# Patient Record
Sex: Female | Born: 1995
Health system: Southern US, Community
[De-identification: ages and names within clinical notes are randomized; demographics above are authoritative.]

## PROBLEM LIST (undated history)

## (undated) DIAGNOSIS — R519 Headache, unspecified: Secondary | ICD-10-CM

## (undated) HISTORY — DX: Headache, unspecified: R51.9

---

## 2017-02-02 ENCOUNTER — Other Ambulatory Visit (HOSPITAL_COMMUNITY)
Admission: RE | Admit: 2017-02-02 | Discharge: 2017-02-02 | Disposition: A | Discharge status: Home / Self Care | Payer: 59 | Source: Ambulatory Visit | Attending: Obstetrics & Gynecology | Admitting: Obstetrics & Gynecology

## 2017-02-02 ENCOUNTER — Ambulatory Visit (INDEPENDENT_AMBULATORY_CARE_PROVIDER_SITE_OTHER): Payer: 59 | Admitting: Certified Nurse Midwife

## 2017-02-02 ENCOUNTER — Encounter: Payer: Self-pay | Admitting: Certified Nurse Midwife

## 2017-02-02 VITALS — BP 108/70 | HR 68 | Resp 16 | Ht 63.25 in | Wt 215.0 lb

## 2017-02-02 DIAGNOSIS — Z0000 Encounter for general adult medical examination without abnormal findings: Secondary | ICD-10-CM | POA: Diagnosis not present

## 2017-02-02 DIAGNOSIS — D649 Anemia, unspecified: Secondary | ICD-10-CM | POA: Insufficient documentation

## 2017-02-02 DIAGNOSIS — Z124 Encounter for screening for malignant neoplasm of cervix: Secondary | ICD-10-CM | POA: Diagnosis not present

## 2017-02-02 DIAGNOSIS — N920 Excessive and frequent menstruation with regular cycle: Secondary | ICD-10-CM | POA: Insufficient documentation

## 2017-02-02 DIAGNOSIS — Z01419 Encounter for gynecological examination (general) (routine) without abnormal findings: Secondary | ICD-10-CM

## 2017-02-02 DIAGNOSIS — Z30011 Encounter for initial prescription of contraceptive pills: Secondary | ICD-10-CM

## 2017-02-02 LAB — POCT URINALYSIS DIPSTICK
BILIRUBIN UA: NEGATIVE
GLUCOSE UA: NEGATIVE
Ketones, UA: NEGATIVE
Nitrite, UA: NEGATIVE
Protein, UA: NEGATIVE
Urobilinogen, UA: NEGATIVE E.U./dL — AB
pH, UA: 5 (ref 5.0–8.0)

## 2017-02-02 MED ORDER — NORGESTIM-ETH ESTRAD TRIPHASIC 0.18/0.215/0.25 MG-35 MCG PO TABS: ORAL_TABLET | ORAL | 4 refills | Status: DC

## 2017-02-02 NOTE — Patient Instructions (Signed)
General topics  Next pap or exam is  due in 1 year Take a Women's multivitamin Take 1200 mg. of calcium daily - prefer dietary If any concerns in interim to call back  Breast Self-Awareness Practicing breast self-awareness may pick up problems early, prevent significant medical complications, and possibly save your life. By practicing breast self-awareness, you can become familiar with how your breasts look and feel and if your breasts are changing. This allows you to notice changes early. It can also offer you some reassurance that your breast health is good. One way to learn what is normal for your breasts and whether your breasts are changing is to do a breast self-exam. If you find a lump or something that was not present in the past, it is best to contact your caregiver right away. Other findings that should be evaluated by your caregiver include nipple discharge, especially if it is bloody; skin changes or reddening; areas where the skin seems to be pulled in (retracted); or new lumps and bumps. Breast pain is seldom associated with cancer (malignancy), but should also be evaluated by a caregiver. BREAST SELF-EXAM The best time to examine your breasts is 5 7 days after your menstrual period is over.  ExitCare Patient Information 2013 [employer].   Exercise to Stay Healthy Exercise helps you become and stay healthy. EXERCISE IDEAS AND TIPS Choose exercises that:  You enjoy.  Fit into your day. You do not need to exercise really hard to be healthy. You can do exercises at a slow or medium level and stay healthy. You can:  Stretch before and after working out.  Try yoga, Pilates, or tai chi.  Lift weights.  Walk fast, swim, jog, run, climb stairs, bicycle, dance, or rollerskate.  Take aerobic classes. Exercises that burn about 150 calories:  Running 1  miles in 15 minutes.  Playing volleyball for 45 to 60 minutes.  Washing and waxing a car for 45 to 60  minutes.  Playing touch football for 45 minutes.  Walking 1  miles in 35 minutes.  Pushing a stroller 1  miles in 30 minutes.  Playing basketball for 30 minutes.  Raking leaves for 30 minutes.  Bicycling 5 miles in 30 minutes.  Walking 2 miles in 30 minutes.  Dancing for 30 minutes.  Shoveling snow for 15 minutes.  Swimming laps for 20 minutes.  Walking up stairs for 15 minutes.  Bicycling 4 miles in 15 minutes.  Gardening for 30 to 45 minutes.  Jumping rope for 15 minutes.  Washing windows or floors for 45 to 60 minutes. Document Released: 10/01/2010 Document Revised: 11/21/2011 Document Reviewed: 10/01/2010 ExitCare Patient Information 2013 [employer].   Other topics ( that may be useful information):    Sexually Transmitted Disease Sexually transmitted disease (STD) refers to any infection that is passed from person to person during sexual activity. This may happen by way of saliva, semen, blood, vaginal mucus, or urine. Common STDs include:  Gonorrhea.  Chlamydia.  Syphilis.  HIV/AIDS.  Genital herpes.  Hepatitis B and C.  Trichomonas.  Human papillomavirus (HPV).  Pubic lice. CAUSES  An STD may be spread by bacteria, virus, or parasite. A person can get an STD by:  Sexual intercourse with an infected person.  Sharing sex toys with an infected person.  Sharing needles with an infected person.  Having intimate contact with the genitals, mouth, or rectal areas of an infected person. SYMPTOMS  Some people may not have any symptoms, but   they can still pass the infection to others. Different STDs have different symptoms. Symptoms include:  Painful or bloody urination.  Pain in the pelvis, abdomen, vagina, anus, throat, or eyes.  Skin rash, itching, irritation, growths, or sores (lesions). These usually occur in the genital or anal area.  Abnormal vaginal discharge.  Penile discharge in men.  Soft, flesh-colored skin growths in the  genital or anal area.  Fever.  Pain or bleeding during sexual intercourse.  Swollen glands in the groin area.  Yellow skin and eyes (jaundice). This is seen with hepatitis. DIAGNOSIS  To make a diagnosis, your caregiver may:  Take a medical history.  Perform a physical exam.  Take a specimen (culture) to be examined.  Examine a sample of discharge under a microscope.  Perform blood test TREATMENT   Chlamydia, gonorrhea, trichomonas, and syphilis can be cured with antibiotic medicine.  Genital herpes, hepatitis, and HIV can be treated, but not cured, with prescribed medicines. The medicines will lessen the symptoms.  Genital warts from HPV can be treated with medicine or by freezing, burning (electrocautery), or surgery. Warts may come back.  HPV is a virus and cannot be cured with medicine or surgery.However, abnormal areas may be followed very closely by your caregiver and may be removed from the cervix, vagina, or vulva through office procedures or surgery. If your diagnosis is confirmed, your recent sexual partners need treatment. This is true even if they are symptom-free or have a negative culture or evaluation. They should not have sex until their caregiver says it is okay. HOME CARE INSTRUCTIONS  All sexual partners should be informed, tested, and treated for all STDs.  Take your antibiotics as directed. Finish them even if you start to feel better.  Only take over-the-counter or prescription medicines for pain, discomfort, or fever as directed by your caregiver.  Rest.  Eat a balanced diet and drink enough fluids to keep your urine clear or pale yellow.  Do not have sex until treatment is completed and you have followed up with your caregiver. STDs should be checked after treatment.  Keep all follow-up appointments, Pap tests, and blood tests as directed by your caregiver.  Only use latex condoms and water-soluble lubricants during sexual activity. Do not use  petroleum jelly or oils.  Avoid alcohol and illegal drugs.  Get vaccinated for HPV and hepatitis. If you have not received these vaccines in the past, talk to your caregiver about whether one or both might be right for you.  Avoid risky sex practices that can break the skin. The only way to avoid getting an STD is to avoid all sexual activity.Latex condoms and dental dams (for oral sex) will help lessen the risk of getting an STD, but will not completely eliminate the risk. SEEK MEDICAL CARE IF:   You have a fever.  You have any new or worsening symptoms. Document Released: 11/19/2002 Document Revised: 11/21/2011 Document Reviewed: 11/26/2010 [facility] Patient Information 2013 [employer].    Domestic Abuse You are being battered or abused if someone close to you hits, pushes, or physically hurts you in any way. You also are being abused if you are forced into activities. You are being sexually abused if you are forced to have sexual contact of any kind. You are being emotionally abused if you are made to feel worthless or if you are constantly threatened. It is important to remember that help is available. No one has the right to abuse you. PREVENTION OF FURTHER  ABUSE  Learn the warning signs of danger. This varies with situations but may include: the use of alcohol, threats, isolation from friends and family, or forced sexual contact. Leave if you feel that violence is going to occur.  If you are attacked or beaten, report it to the police so the abuse is documented. You do not have to press charges. The police can protect you while you or the attackers are leaving. Get the officer's name and badge number and a copy of the report.  Find someone you can trust and tell them what is happening to you: your caregiver, a nurse, clergy member, close friend or family member. Feeling ashamed is natural, but remember that you have done nothing wrong. No one deserves abuse. Document Released:  08/26/2000 Document Revised: 11/21/2011 Document Reviewed: 11/04/2010 ExitCare Patient Information 2013 [employer].    How Much is Too Much Alcohol? Drinking too much alcohol can cause injury, accidents, and health problems. These types of problems can include:   Car crashes.  Falls.  Family fighting (domestic violence).  Drowning.  Fights.  Injuries.  Burns.  Damage to certain organs.  Having a baby with birth defects. ONE DRINK CAN BE TOO MUCH WHEN YOU ARE:  Working.  Pregnant or breastfeeding.  Taking medicines. Ask your doctor.  Driving or planning to drive. If you or someone you know has a drinking problem, get help from a doctor.  Document Released: 06/25/2009 Document Revised: 11/21/2011 Document Reviewed: 06/25/2009 ExitCare Patient Information 2013 [employer].   Smoking Hazards Smoking cigarettes is extremely bad for your health. Tobacco smoke has over 200 known poisons in it. There are over 60 chemicals in tobacco smoke that cause cancer. Some of the chemicals found in cigarette smoke include:   Cyanide.  Benzene.  Formaldehyde.  Methanol (wood alcohol).  Acetylene (fuel used in welding torches).  Ammonia. Cigarette smoke also contains the poisonous gases nitrogen oxide and carbon monoxide.  Cigarette smokers have an increased risk of many serious medical problems and Smoking causes approximately:  90% of all lung cancer deaths in men.  80% of all lung cancer deaths in women.  90% of deaths from chronic obstructive lung disease. Compared with nonsmokers, smoking increases the risk of:  Coronary heart disease by 2 to 4 times.  Stroke by 2 to 4 times.  Men developing lung cancer by 23 times.  Women developing lung cancer by 13 times.  Dying from chronic obstructive lung diseases by 12 times.  . Smoking is the most preventable cause of death and disease in our society.  WHY IS SMOKING ADDICTIVE?  Nicotine is the chemical  agent in tobacco that is capable of causing addiction or dependence.  When you smoke and inhale, nicotine is absorbed rapidly into the bloodstream through your lungs. Nicotine absorbed through the lungs is capable of creating a powerful addiction. Both inhaled and non-inhaled nicotine may be addictive.  Addiction studies of cigarettes and spit tobacco show that addiction to nicotine occurs mainly during the teen years, when young people begin using tobacco products. WHAT ARE THE BENEFITS OF QUITTING?  There are many health benefits to quitting smoking.   Likelihood of developing cancer and heart disease decreases. Health improvements are seen almost immediately.  Blood pressure, pulse rate, and breathing patterns start returning to normal soon after quitting. QUITTING SMOKING   American Lung Association - 1-800-LUNGUSA  American Cancer Society - 1-800-ACS-2345 Document Released: 10/06/2004 Document Revised: 11/21/2011 Document Reviewed: 06/10/2009 ExitCare Patient Information 2013 ExitCare[employer]   LL[employer].   Stress Management Stress is a state of physical or mental tension that often results from changes in your life or normal routine. Some common causes of stress are:  Death of a loved one.  Injuries or severe illnesses.  Getting fired or changing jobs.  Moving into a new home. Other causes may be:  Sexual problems.  Business or financial losses.  Taking on a large debt.  Regular conflict with someone at home or at work.  Constant tiredness from lack of sleep. It is not just bad things that are stressful. It may be stressful to:  Win the lottery.  Get married.  Buy a new car. The amount of stress that can be easily tolerated varies from person to person. Changes generally cause stress, regardless of the types of change. Too much stress can affect your health. It may lead to physical or emotional problems. Too little stress (boredom) may also become stressful. SUGGESTIONS TO  REDUCE STRESS:  Talk things over with your family and friends. It often is helpful to share your concerns and worries. If you feel your problem is serious, you may want to get help from a professional counselor.  Consider your problems one at a time instead of lumping them all together. Trying to take care of everything at once may seem impossible. List all the things you need to do and then start with the most important one. Set a goal to accomplish 2 or 3 things each day. If you expect to do too many in a single day you will naturally fail, causing you to feel even more stressed.  Do not use alcohol or drugs to relieve stress. Although you may feel better for a short time, they do not remove the problems that caused the stress. They can also be habit forming.  Exercise regularly - at least 3 times per week. Physical exercise can help to relieve that "uptight" feeling and will relax you.  The shortest distance between despair and hope is often a good night's sleep.  Go to bed and get up on time allowing yourself time for appointments without being rushed.  Take a short "time-out" period from any stressful situation that occurs during the day. Close your eyes and take some deep breaths. Starting with the muscles in your face, tense them, hold it for a few seconds, then relax. Repeat this with the muscles in your neck, shoulders, hand, stomach, back and legs.  Take good care of yourself. Eat a balanced diet and get plenty of rest.  Schedule time for having fun. Take a break from your daily routine to relax. HOME CARE INSTRUCTIONS   Call if you feel overwhelmed by your problems and feel you can no longer manage them on your own.  Return immediately if you feel like hurting yourself or someone else. Document Released: 02/22/2001 Document Revised: 11/21/2011 Document Reviewed: 10/15/2007 ExitCare Patient Information 2013 [employer]Care[employer].   

## 2017-02-02 NOTE — Progress Notes (Signed)
21 y.o. G0P0000 Single  African American Fe here for annual exam. Periods normal, duration 3-4 days, minimal cramping no medication required. Light flow with OCP use. Contraception working well,denies warning signs. Currently sexually active with new partner with condom use also, had one occurrence of no protection. Stressing about and would like all STD screening. Partner has not been tested. Also feels like irritation on vulva from hair growth, no itching or burning, just sensitivity. Please check. No other health issues today. 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  Patient's last menstrual period was 01/15/2017.          Sexually active: Yes.    The current method of family planning is OCP (estrogen/progesterone).    Exercising: No.  exercise Smoker:  no  Health Maintenance: Pap:  5430yrs ago. Normal per patient History of Abnormal Pap: no MMG:  none Self Breast exams: occasional Colonoscopy:  none BMD:   none TDaP:  UTD Shingles: no Pneumonia: no Hep C and HIV: not done Labs: poct urine-rbc tr, wbc tr   reports that she has never smoked. She has never used smokeless tobacco. She reports that she drinks alcohol. She reports that she does not use drugs.  History reviewed. No pertinent past medical history.  History reviewed. No pertinent surgical history.  Current Outpatient Prescriptions  Medication Sig Dispense Refill  . Norgestimate-Ethinyl Estradiol Triphasic (ORTHO TRI-CYCLEN, 28,) 0.18/0.215/0.25 MG-35 MCG tablet Ortho Tri-Cyclen (28) 0.18 mg(7)/0.215 mg(7)/0.25 mg(7)-35 mcg tablet  TAKE 1 TABLET BY MOUTH EVERY DAY    . UNABLE TO FIND Refresh po     No current facility-administered medications for this visit.     Family History  Problem Relation Age of Onset  . Heart attack Maternal Grandmother   . Cancer Maternal Grandfather   . Diabetes Maternal Grandfather   . Hypertension Maternal Grandfather     ROS:  Pertinent items are noted in HPI.  Otherwise, a  comprehensive ROS was negative.  Exam:   BP 108/70   Pulse 68   Resp 16   Ht 5' 3.25" (1.607 m)   Wt 215 lb (97.5 kg)   LMP 01/15/2017   BMI 37.79 kg/m  Height: 5' 3.25" (160.7 cm) Ht Readings from Last 3 Encounters:  02/02/17 5' 3.25" (1.607 m)    General appearance: alert, cooperative and appears stated age Head: Normocephalic, without obvious abnormality, atraumatic Neck: no adenopathy, supple, symmetrical, trachea midline and thyroid normal to inspection and palpation Lungs: clear to auscultation bilaterally Breasts: normal appearance, no masses or tenderness, No nipple retraction or dimpling, No nipple discharge or bleeding, No axillary or supraclavicular adenopathy Heart: regular rate and rhythm Abdomen: soft, non-tender; no masses,  no organomegaly Extremities: extremities normal, atraumatic, no cyanosis or edema Skin: Skin color, texture, turgor normal. No rashes or lesions Lymph nodes: Cervical, supraclavicular, and axillary nodes normal. No abnormal inguinal nodes palpated Neurologic: Grossly normal   Pelvic: External genitalia:  no lesions, no redness or increase pink, no ingrown hair appearance, no scaling, patient identified area of concern, no abnormal finding seen              Urethra:  normal appearing urethra with no masses, tenderness or lesions              Bartholin's and Skene's: normal                 Vagina: normal appearing vagina with normal color and discharge, no lesions, specimens collected  Cervix: no bleeding following Pap, no cervical motion tenderness, no lesions and nulliparous appearance              Pap taken: Yes.   Bimanual Exam:  Uterus:  normal size, contour, position, consistency, mobility, non-tender              Adnexa: normal adnexa and no mass, fullness, tenderness               Rectovaginal: Confirms               Anus:  Normal appearance  Chaperone present: yes  A:  Well Woman with normal exam  Contraception OCP  desired  ? Irritation feel with perspiration on vulva  Screening labs  P:   Reviewed health and wellness pertinent to exam  Discussed continuing OCP, warning signs and risks/benefits. Patient doing well with good cycle control and consistent use.   Rx Ortho Tri-cyclen see order with instructions  Discussed what she is feeling on vulva could be from perspiration. No visible signs noted of skin change. Suggested coconut oil protection on vulva when will be active outside to see if this will stop the sensation she feels. Will advise if no change. Avoid shaving, clipping should work better. Questions addressed. Patient expressed she felt much better after exam and discussion.  Labs: STD panel, Hep C, HSV 1,2, Affirm  Pap smear: yes   counseled on breast self exam, STD prevention, HIV risk factors and prevention, feminine hygiene, use and side effects of OCP's, adequate intake of calcium and vitamin D, diet and exercise  return annually or prn  An After Visit Summary was printed and given to the patient.

## 2017-02-03 ENCOUNTER — Other Ambulatory Visit: Payer: Self-pay

## 2017-02-03 LAB — STD PANEL
HEP B S AG: NEGATIVE
HIV 1&2 Ab, 4th Generation: NONREACTIVE

## 2017-02-03 LAB — HEPATITIS C ANTIBODY: HCV Ab: NEGATIVE

## 2017-02-03 LAB — HSV(HERPES SIMPLEX VRS) I + II AB-IGG: HSV 2 Glycoprotein G Ab, IgG: <0.9 Index (ref <0.90)

## 2017-02-03 LAB — WET PREP BY MOLECULAR PROBE
Candida species: DETECTED — AB
GARDNERELLA VAGINALIS: NOT DETECTED
TRICHOMONAS VAG: NOT DETECTED

## 2017-02-03 MED ORDER — TERCONAZOLE 0.4 % VA CREA: TOPICAL_CREAM | VAGINAL | 0 refills | Status: DC

## 2017-02-07 ENCOUNTER — Telehealth: Payer: Self-pay | Admitting: Certified Nurse Midwife

## 2017-02-07 LAB — CYTOLOGY - PAP
Chlamydia: NEGATIVE
DIAGNOSIS: NEGATIVE
Neisseria Gonorrhea: NEGATIVE

## 2017-02-07 NOTE — Telephone Encounter (Signed)
Allison Duncan, CNM -please review HSV labs dated 02/02/17 and advise?

## 2017-02-07 NOTE — Telephone Encounter (Signed)
Patient is calling for recent lab results. Patient said she received some of her labs results last Friday but the HSV was not back yet.

## 2017-02-07 NOTE — Telephone Encounter (Signed)
Viewed by Cristopher EstimableKeviele Duncan on 02/07/2017 3:54 PM  Written by Allison Duncan, Allison Duncan, CMA on 02/07/2017 1:37 PM  Hello [name],  Your labwork for HSV (herpes) 1,2 are both negative, which is great news. Gonorrhea & Chlamydia are negative.  Pap smear is negative also. It only showed yeast on your pap but this was already addressed. You were already called with your other results.  Have a great day.     Routing to provider for final review. Patient is agreeable to disposition. Will close encounter.

## 2017-02-07 NOTE — Telephone Encounter (Signed)
Sent result note to Joy to call patient

## 2018-02-11 ENCOUNTER — Other Ambulatory Visit: Payer: Self-pay | Admitting: Certified Nurse Midwife

## 2018-02-11 DIAGNOSIS — Z30011 Encounter for initial prescription of contraceptive pills: Secondary | ICD-10-CM

## 2018-02-12 NOTE — Telephone Encounter (Signed)
Medication refill request: OCP  Last AEX:  02-02-17  Next AEX: Spoke with patient and she is seeing another provider- Patient advised that she does not need it Last MMG (if hormonal medication request): N/A Refill authorized: please advise

## 2019-07-14 ENCOUNTER — Encounter (HOSPITAL_COMMUNITY): Payer: Self-pay | Admitting: Emergency Medicine

## 2019-07-14 ENCOUNTER — Emergency Department (HOSPITAL_COMMUNITY)
Admission: EM | Admit: 2019-07-14 | Discharge: 2019-07-14 | Disposition: A | Discharge status: Home / Self Care | Payer: Managed Care, Other (non HMO) | Attending: Emergency Medicine | Admitting: Emergency Medicine

## 2019-07-14 ENCOUNTER — Other Ambulatory Visit: Payer: Self-pay

## 2019-07-14 ENCOUNTER — Emergency Department (HOSPITAL_COMMUNITY): Payer: Managed Care, Other (non HMO)

## 2019-07-14 DIAGNOSIS — Z79899 Other long term (current) drug therapy: Secondary | ICD-10-CM | POA: Insufficient documentation

## 2019-07-14 DIAGNOSIS — M79602 Pain in left arm: Secondary | ICD-10-CM | POA: Diagnosis present

## 2019-07-14 DIAGNOSIS — M5412 Radiculopathy, cervical region: Secondary | ICD-10-CM | POA: Insufficient documentation

## 2019-07-14 MED ORDER — TIZANIDINE HCL 4 MG PO TABS: 4.0000 mg | ORAL_TABLET | Freq: Four times a day (QID) | ORAL | 0 refills | Status: DC | PRN

## 2019-07-14 NOTE — ED Provider Notes (Signed)
[facility] EMERGENCY DEPARTMENT Provider Note   CSN: [id number] Arrival date & time: 07/14/19  1144     History   Chief Complaint Chief Complaint  Patient presents with  . Arm Pain    HPI Allison Duncan is a 23 y.o. female.     23 year old female presents with complaint of discomfort in her left arm for the past week. Patient went to UC and was told she had a sprain, treated with diclofenac without improvement. Patient returned to UC today, concerned something more was going on, now with discomfort in her right arm.  Patient was again told she had a sprain, given an injection of Toradol without improvement and then came to the emergency room for further work-up.  Patient reports prior strain to her right arm after lifting something at work several months ago, states that she did not have any injury with her pain at this time and questions a strain diagnosis.  Patient states that at times her left hand feels cold, no changes in the skin color, reports pins-and-needles feeling in her left hand at times.  No other complaints or concerns.     History reviewed. No pertinent past medical history.  Patient Active Problem List   Diagnosis Date Noted  . Anemia 02/02/2017  . Menorrhagia 02/02/2017    History reviewed. No pertinent surgical history.   OB History    Gravida  0   Para  0   Term  0   Preterm  0   AB  0   Living  0     SAB  0   TAB  0   Ectopic  0   Multiple  0   Live Births  0            Home Medications    Prior to Admission medications   Medication Sig Start Date End Date Taking? Authorizing Provider  Norgestimate-Ethinyl Estradiol Triphasic (ORTHO TRI-CYCLEN, 28,) 0.18/0.215/0.25 MG-35 MCG tablet Take one tablet daily by mouth 02/02/17   Verner Chol, CNM  terconazole (TERAZOL 7) 0.4 % vaginal cream Use 1 applicatorful vaginally every night for 7 nights 02/03/17   Verner Chol, CNM  tiZANidine (ZANAFLEX) 4 MG  tablet Take 1 tablet (4 mg total) by mouth every 6 (six) hours as needed for muscle spasms. 07/14/19   Jeannie Fend, PA-C  UNABLE TO FIND Refresh po    [provider]    Family History Family History  Problem Relation Age of Onset  . Heart attack Maternal Grandmother   . Cancer Maternal Grandfather   . Diabetes Maternal Grandfather   . Hypertension Maternal Grandfather     Social History Social History   Tobacco Use  . Smoking status: Never Smoker  . Smokeless tobacco: Never Used  Substance Use Topics  . Alcohol use: Yes    Comment: 2 a month  . Drug use: No     Allergies   Amoxicillin-pot clavulanate, Cefdinir, and Cefprozil   Review of Systems Review of Systems  Constitutional: Negative for fever.  Musculoskeletal: Positive for myalgias. Negative for arthralgias.  Skin: Negative for color change, rash and wound.  Allergic/Immunologic: Negative for immunocompromised state.  Neurological: Negative for weakness and numbness.  Psychiatric/Behavioral: Negative for confusion.  All other systems reviewed and are negative.    Physical Exam Updated Vital Signs BP 112/87 (BP Location: Right Arm)   Pulse (!) 102   Temp 98.5 F (36.9 C) (Oral)   Resp 16  LMP 07/06/2019   SpO2 100%   Physical Exam Vitals signs and nursing note reviewed.  Constitutional:      General: She is not in acute distress.    Appearance: She is well-developed. She is not diaphoretic.  HENT:     Head: Normocephalic and atraumatic.  Neck:     Musculoskeletal: Normal range of motion. Muscular tenderness present.  Cardiovascular:     Pulses: Normal pulses.  Pulmonary:     Effort: Pulmonary effort is normal.  Chest:    Musculoskeletal: Normal range of motion.        General: Tenderness present. No swelling, deformity or signs of injury.     Cervical back: She exhibits tenderness. She exhibits normal range of motion and no bony tenderness.       Back:  Skin:    General:  Skin is warm and dry.     Capillary Refill: Capillary refill takes less than 2 seconds.     Findings: No erythema or rash.  Neurological:     Mental Status: She is alert and oriented to person, place, and time.     Sensory: No sensory deficit.     Motor: No weakness.  Psychiatric:        Behavior: Behavior normal.      ED Treatments / Results  Labs (all labs ordered are listed, but only abnormal results are displayed) Labs Reviewed - No data to display  EKG None  Radiology Dg Cervical Spine Complete  Result Date: 07/14/2019 CLINICAL DATA:  C/o L upper arm feeling "funny" x 1 week. States she believes she has poor circulation in arm. Reports fingers have started to have spasms and tightness to L upper arm like a rubber band is on it. Seen earlier in week and took anti-inflammatory without relief. Now L side of neck feels sensitive and R arm is starting to feel the same way since yesterday. [facility] EXAM: CERVICAL SPINE - COMPLETE 4+ VIEW COMPARISON:  None. FINDINGS: Normal alignment allowing mild neck flexion. No fracture or bone lesion. Discs are well maintained. No degenerative changes. Neural foramina are widely patent. Soft tissues are unremarkable. IMPRESSION: Negative cervical spine radiographs. Electronically Signed   By: Amie Portlandavid  Ormond M.D.   On: 07/14/2019 14:44    Procedures Procedures (including critical care time)  Medications Ordered in ED Medications - No data to display   Initial Impression / Assessment and Plan / ED Course  I have reviewed the triage vital signs and the nursing notes.  Pertinent labs & imaging results that were available during my care of the patient were reviewed by me and considered in my medical decision making (see chart for details).  Clinical Course as of Jul 13 1536  Wynelle LinkSun Jul 14, 2019  [id number] 23 year old female with pain in her left arm denies pain to the right arm.  History and exam consistent with cervical  radiculopathy, no history of trauma.  Patient has tenderness through her left and right trapezius areas extending down along the medial border the scapula.  Also soreness in left right pectoral muscle area.  Grip strength is equal, sensation intact, strong radial pulses bilaterally.  Discussed likely cervical radiculopathy with patient, C-spine x-ray obtained without significant findings.  Patient been taking NSAIDs from urgent care, will add muscle relaxer to this, recommend warm compresses followed by gentle stretching and follow-up with PCP for possible physical therapy referral.  Also to discuss with work ergonomic changes to her  desk space.  Patient states was previously working on the floor at [employer] doing a lot of heavy lifting, now with promotion of sitting at a desk and more sedentary than usual.  Patient to return to ER for worsening or concerning symptoms otherwise follow-up as planned.   [LM]    Clinical Course User Index [LM] Tacy Learn, PA-C      Final Clinical Impressions(s) / ED Diagnoses   Final diagnoses:  Cervical radiculopathy    ED Discharge Orders         Ordered    tiZANidine (ZANAFLEX) 4 MG tablet  Every 6 hours PRN     07/14/19 1521           Tacy Learn, PA-C 07/14/19 1537    Carmin Muskrat, MD 07/14/19 1549

## 2019-07-14 NOTE — ED Notes (Signed)
Patient verbalizes understanding of discharge instructions. Opportunity for questioning and answers were provided. Armband removed by staff, pt discharged from ED.  

## 2019-07-14 NOTE — ED Triage Notes (Signed)
C/o L upper arm feeling "funny" x 1 week.  States she believes she has poor circulation in arm.  Reports fingers have started to have spasms and tightness to L upper arm like a rubber band is on it.  Seen earlier in week and took anti-inflammatory without relief.  Now L side of neck feels sensitive and R arm is starting to feel the same way since yesterday.  Seen at [facility] today for same.

## 2019-07-14 NOTE — Discharge Instructions (Addendum)
Warm compresses to shoulders for 30 minutes at a time followed by gentle stretching as discussed. You can continue taking the anti-inflammatory prescribed by urgent care, you can also take the muscle relaxer prescribed today.  Do not drive or operate machinery until you know how this muscle relaxer affects you. Follow-up with primary care provider to discuss possible referral to physical therapy. Talk to employee health at your job to see if someone can help work on Personal assistant at your workstation.

## 2019-11-25 ENCOUNTER — Encounter: Payer: Self-pay | Admitting: Certified Nurse Midwife

## 2019-11-27 ENCOUNTER — Encounter: Payer: Self-pay | Admitting: Certified Nurse Midwife

## 2019-12-28 ENCOUNTER — Emergency Department (HOSPITAL_COMMUNITY): Payer: Managed Care, Other (non HMO)

## 2019-12-28 ENCOUNTER — Encounter (HOSPITAL_COMMUNITY): Payer: Self-pay | Admitting: Emergency Medicine

## 2019-12-28 ENCOUNTER — Emergency Department (HOSPITAL_COMMUNITY)
Admission: EM | Admit: 2019-12-28 | Discharge: 2019-12-29 | Disposition: A | Discharge status: Home / Self Care | Payer: Managed Care, Other (non HMO) | Attending: Emergency Medicine | Admitting: Emergency Medicine

## 2019-12-28 ENCOUNTER — Other Ambulatory Visit: Payer: Self-pay

## 2019-12-28 DIAGNOSIS — J029 Acute pharyngitis, unspecified: Secondary | ICD-10-CM | POA: Diagnosis not present

## 2019-12-28 DIAGNOSIS — Z20822 Contact with and (suspected) exposure to covid-19: Secondary | ICD-10-CM | POA: Diagnosis not present

## 2019-12-28 DIAGNOSIS — J3081 Allergic rhinitis due to animal (cat) (dog) hair and dander: Secondary | ICD-10-CM | POA: Insufficient documentation

## 2019-12-28 DIAGNOSIS — R079 Chest pain, unspecified: Secondary | ICD-10-CM | POA: Diagnosis present

## 2019-12-28 LAB — BASIC METABOLIC PANEL
Anion gap: 8 (ref 5–15)
BUN: 10 mg/dL (ref 6–20)
CO2: 28 mmol/L (ref 22–32)
Calcium: 9.3 mg/dL (ref 8.9–10.3)
Chloride: 104 mmol/L (ref 98–111)
Creatinine, Ser: 0.8 mg/dL (ref 0.44–1.00)
GFR calc Af Amer: >60 mL/min (ref >60)
GFR calc non Af Amer: >60 mL/min (ref >60)
Glucose, Bld: 110 mg/dL — ABNORMAL HIGH (ref 70–99)
Potassium: 3.8 mmol/L (ref 3.5–5.1)
Sodium: 140 mmol/L (ref 135–145)

## 2019-12-28 LAB — CBC
HCT: 38.7 % (ref 36.0–46.0)
Hemoglobin: 12.6 g/dL (ref 12.0–15.0)
MCH: 30.2 pg (ref 26.0–34.0)
MCHC: 32.6 g/dL (ref 30.0–36.0)
MCV: 92.8 fL (ref 80.0–100.0)
Platelets: 253 10*3/uL (ref 150–400)
RBC: 4.17 MIL/uL (ref 3.87–5.11)
RDW: 12.2 % (ref 11.5–15.5)
WBC: 7.7 10*3/uL (ref 4.0–10.5)
nRBC: 0 % (ref 0.0–0.2)

## 2019-12-28 LAB — TROPONIN I (HIGH SENSITIVITY)
Troponin I (High Sensitivity): <2 ng/L (ref <18)
Troponin I (High Sensitivity): <2 ng/L (ref <18)

## 2019-12-28 LAB — I-STAT BETA HCG BLOOD, ED (MC, WL, AP ONLY): I-stat hCG, quantitative: <5 m[IU]/mL (ref <5)

## 2019-12-28 LAB — POC SARS CORONAVIRUS 2 AG -  ED: SARS Coronavirus 2 Ag: NEGATIVE

## 2019-12-28 MED ORDER — SODIUM CHLORIDE 0.9% FLUSH: 3.0000 mL | Freq: Once | INTRAVENOUS | Status: DC

## 2019-12-28 NOTE — ED Notes (Signed)
POC COVID result of NEGATIVE reported to Dr. C. Tegeler 

## 2019-12-28 NOTE — ED Triage Notes (Signed)
Pt reports sore throat and headache X1 week.  For the past two days has had substernal chest pain that radiates to her back.

## 2019-12-29 NOTE — ED Provider Notes (Addendum)
[facility] EMERGENCY DEPARTMENT Provider Note  CSN: [id number] Arrival date & time: 12/28/19 1921  Chief Complaint(s) Chest Pain and Sore Throat  HPI Allison Duncan is a 24 y.o. female    Sore Throat This is a recurrent problem. Episode onset: several weeks. The problem occurs constantly. The problem has not changed since onset.Associated symptoms include chest pain. The symptoms are aggravated by smoking and sneezing. Nothing relieves the symptoms. Treatments tried: clarithromycin rxd at [facility]. The treatment provided no relief.   Additionally patient is complaining of bilateral chest pain as well as upper back pain worse with sneezing, movement and palpation.  Patient reports she has seasonal allergies and is allergic to cats.  Reports that she recently got a cat.  She has not attempted to take any antihistamines.  Was seen in urgent care and diagnosed with sinusitis and given clarithromycin which has not improved her symptoms.  Past Medical History History reviewed. No pertinent past medical history. Patient Active Problem List   Diagnosis Date Noted  . Anemia 02/02/2017  . Menorrhagia 02/02/2017   Home Medication(s) Prior to Admission medications   Medication Sig Start Date End Date Taking? Authorizing Provider  Norgestimate-Ethinyl Estradiol Triphasic (ORTHO TRI-CYCLEN, 28,) 0.18/0.215/0.25 MG-35 MCG tablet Take one tablet daily by mouth 02/02/17   Verner Chol, CNM  terconazole (TERAZOL 7) 0.4 % vaginal cream Use 1 applicatorful vaginally every night for 7 nights 02/03/17   Verner Chol, CNM  tiZANidine (ZANAFLEX) 4 MG tablet Take 1 tablet (4 mg total) by mouth every 6 (six) hours as needed for muscle spasms. 07/14/19   Jeannie Fend, PA-C  UNABLE TO FIND Refresh po    [provider]                                                                                                                                    Past Surgical History  History reviewed. No pertinent surgical history. Family History Family History  Problem Relation Age of Onset  . Heart attack Maternal Grandmother   . Cancer Maternal Grandfather   . Diabetes Maternal Grandfather   . Hypertension Maternal Grandfather     Social History Social History   Tobacco Use  . Smoking status: Never Smoker  . Smokeless tobacco: Never Used  Substance Use Topics  . Alcohol use: Yes    Comment: 2 a month  . Drug use: No   Allergies Amoxicillin-pot clavulanate, Cefdinir, and Cefprozil  Review of Systems Review of Systems  Cardiovascular: Positive for chest pain.   All other systems are reviewed and are negative for acute change except as noted in the HPI  Physical Exam Vital Signs  I have reviewed the triage vital signs BP 120/71 (BP Location: Right Arm)   Pulse 72   Temp 98.2 F (36.8 C) (Oral)   Resp 17   Ht 5\' 3"  (1.6 m)   Wt 99.8 kg  LMP 12/08/2019   SpO2 99%   BMI 38.97 kg/m   Physical Exam Vitals reviewed.  Constitutional:      General: She is not in acute distress.    Appearance: She is well-developed. She is not diaphoretic.  HENT:     Head: Normocephalic and atraumatic.     Nose: Mucosal edema present.     Mouth/Throat:     Pharynx: No pharyngeal swelling, oropharyngeal exudate or uvula swelling.     Tonsils: No tonsillar exudate.     Comments: cobblestoning Eyes:     General: No scleral icterus.       Right eye: No discharge.        Left eye: No discharge.     Conjunctiva/sclera: Conjunctivae normal.     Pupils: Pupils are equal, round, and reactive to light.  Cardiovascular:     Rate and Rhythm: Normal rate and regular rhythm.     Heart sounds: No murmur. No friction rub. No gallop.   Pulmonary:     Effort: Pulmonary effort is normal. No respiratory distress.     Breath sounds: Normal breath sounds. No stridor. No rales.  Chest:     Chest wall: Tenderness present.    Abdominal:     General: There is no  distension.     Palpations: Abdomen is soft.     Tenderness: There is no abdominal tenderness.  Musculoskeletal:     Cervical back: Normal range of motion and neck supple.     Thoracic back: Tenderness present.       Back:  Skin:    General: Skin is warm and dry.     Findings: No erythema or rash.  Neurological:     Mental Status: She is alert and oriented to person, place, and time.     ED Results and Treatments Labs (all labs ordered are listed, but only abnormal results are displayed) Labs Reviewed  BASIC METABOLIC PANEL - Abnormal; Notable for the following components:      Result Value   Glucose, Bld 110 (*)    All other components within normal limits  CBC  I-STAT BETA HCG BLOOD, ED ([facility], [facility], [facility] ONLY)  POC SARS CORONAVIRUS 2 AG -  ED  TROPONIN I (HIGH SENSITIVITY)  TROPONIN I (HIGH SENSITIVITY)                                                                                                                         EKG  EKG Interpretation  Date/Time:  Saturday December 28 2019 20:06:26 EDT Ventricular Rate:  54 PR Interval:  162 QRS Duration: 96 QT Interval:  414 QTC Calculation: 392 R Axis:   88 Text Interpretation: Sinus bradycardia with sinus arrhythmia Incomplete right bundle branch block Borderline ECG No old tracing to compare Confirmed by Drema Pry [phone]) on 12/29/2019 4:48:21 AM      Radiology DG Chest Portable 1 View  Result Date: 12/28/2019 CLINICAL DATA:  Central chest pain for 3  days, pain radiating to left arm, sore throat EXAM: PORTABLE CHEST 1 VIEW COMPARISON:  None. FINDINGS: The heart size and mediastinal contours are within normal limits. Both lungs are clear. The visualized skeletal structures are unremarkable. IMPRESSION: No active disease. Electronically Signed   By: Randa Ngo M.D.   On: 12/28/2019 21:30    Pertinent labs & imaging results that were available during my care of the patient were reviewed by me and considered in my medical  decision making (see chart for details).  Medications Ordered in ED Medications  sodium chloride flush (NS) 0.9 % injection 3 mL (has no administration in time range)                                                                                                                                    Procedures Procedures  (including critical care time)  Medical Decision Making / ED Course I have reviewed the nursing notes for this encounter and the patient's prior records (if available in EHR or on provided paperwork).   Latonya Nelon was evaluated in Emergency Department on 12/29/2019 for the symptoms described in the history of present illness. She was evaluated in the context of the global COVID-19 pandemic, which necessitated consideration that the patient might be at risk for infection with the SARS-CoV-2 virus that causes COVID-19. Institutional protocols and algorithms that pertain to the evaluation of patients at risk for COVID-19 are in a state of rapid change based on information released by regulatory bodies including the CDC and federal and state organizations. These policies and algorithms were followed during the patient's care in the ED.  Exam is most suspicious for allergic rhinitis.  No evidence to suggest pharyngitis, epiglottitis.  Chest and back pain muscular related.  No cardiac symptoms.  Work-up in triage reassuring including nonischemic EKG and negative troponin.  No anemia, leukocytosis.  No significant electrolyte derangement or renal sufficiency.  Chest x-ray without evidence of pneumonia, pneumothorax.  Doubt pulmonary embolism.  Recommended over-the-counter antihistamines and steroid nasal sprays.  PCP follow-up      Final Clinical Impression(s) / ED Diagnoses Final diagnoses:  Allergic rhinitis due to animal hair and dander    The patient appears reasonably screened and/or stabilized for discharge and I doubt any other medical condition or other [facility] requiring  further screening, evaluation, or treatment in the ED at this time prior to discharge. Safe for discharge with strict return precautions.  Disposition: Discharge  Condition: Good  I have discussed the results, Dx and Tx plan with the patient/family who expressed understanding and agree(s) with the plan. Discharge instructions discussed at length. The patient/family was given strict return precautions who verbalized understanding of the instructions. No further questions at time of discharge.    ED Discharge Orders    None        Follow Up: Primary care provider  Schedule an appointment as soon as possible for  a visit  If you do not have a primary care physician, contact [facility] at [phone] for referral     This chart was dictated using voice recognition software.  Despite best efforts to proofread,  errors can occur which can change the documentation meaning.     Nira Conn, MD 12/29/19 [phone]

## 2019-12-29 NOTE — ED Notes (Signed)
Patient verbalizes understanding of discharge instructions. Opportunity for questioning and answers were provided. Armband removed by staff, pt discharged from ED stable & ambulatory  

## 2020-02-04 ENCOUNTER — Other Ambulatory Visit: Payer: Self-pay | Admitting: Obstetrics and Gynecology

## 2020-02-04 DIAGNOSIS — N644 Mastodynia: Secondary | ICD-10-CM

## 2020-03-25 ENCOUNTER — Other Ambulatory Visit: Payer: Managed Care, Other (non HMO)

## 2020-04-22 ENCOUNTER — Other Ambulatory Visit: Payer: Managed Care, Other (non HMO)

## 2020-06-09 DIAGNOSIS — Z3009 Encounter for other general counseling and advice on contraception: Secondary | ICD-10-CM | POA: Diagnosis not present

## 2020-06-09 DIAGNOSIS — R030 Elevated blood-pressure reading, without diagnosis of hypertension: Secondary | ICD-10-CM | POA: Diagnosis not present

## 2020-06-17 DIAGNOSIS — Z6839 Body mass index (BMI) 39.0-39.9, adult: Secondary | ICD-10-CM | POA: Diagnosis not present

## 2020-06-17 DIAGNOSIS — R87610 Atypical squamous cells of undetermined significance on cytologic smear of cervix (ASC-US): Secondary | ICD-10-CM | POA: Diagnosis not present

## 2020-06-17 DIAGNOSIS — Z01419 Encounter for gynecological examination (general) (routine) without abnormal findings: Secondary | ICD-10-CM | POA: Diagnosis not present

## 2020-06-17 DIAGNOSIS — Z118 Encounter for screening for other infectious and parasitic diseases: Secondary | ICD-10-CM | POA: Diagnosis not present

## 2020-07-20 DIAGNOSIS — R079 Chest pain, unspecified: Secondary | ICD-10-CM | POA: Diagnosis not present

## 2020-07-20 DIAGNOSIS — M542 Cervicalgia: Secondary | ICD-10-CM | POA: Diagnosis not present

## 2020-07-20 DIAGNOSIS — J029 Acute pharyngitis, unspecified: Secondary | ICD-10-CM | POA: Diagnosis not present

## 2020-07-20 DIAGNOSIS — R0600 Dyspnea, unspecified: Secondary | ICD-10-CM | POA: Diagnosis not present

## 2020-08-20 ENCOUNTER — Other Ambulatory Visit: Payer: Managed Care, Other (non HMO)

## 2020-08-21 DIAGNOSIS — L03115 Cellulitis of right lower limb: Secondary | ICD-10-CM | POA: Diagnosis not present

## 2020-09-02 DIAGNOSIS — M549 Dorsalgia, unspecified: Secondary | ICD-10-CM | POA: Diagnosis not present

## 2020-09-03 DIAGNOSIS — R1084 Generalized abdominal pain: Secondary | ICD-10-CM | POA: Diagnosis not present

## 2020-09-03 DIAGNOSIS — R102 Pelvic and perineal pain: Secondary | ICD-10-CM | POA: Diagnosis not present

## 2020-09-03 DIAGNOSIS — N760 Acute vaginitis: Secondary | ICD-10-CM | POA: Diagnosis not present

## 2020-09-07 DIAGNOSIS — Z03818 Encounter for observation for suspected exposure to other biological agents ruled out: Secondary | ICD-10-CM | POA: Diagnosis not present

## 2020-11-23 DIAGNOSIS — R059 Cough, unspecified: Secondary | ICD-10-CM | POA: Diagnosis not present

## 2020-11-23 DIAGNOSIS — J029 Acute pharyngitis, unspecified: Secondary | ICD-10-CM | POA: Diagnosis not present

## 2020-11-23 DIAGNOSIS — J209 Acute bronchitis, unspecified: Secondary | ICD-10-CM | POA: Diagnosis not present

## 2020-11-23 DIAGNOSIS — R091 Pleurisy: Secondary | ICD-10-CM | POA: Diagnosis not present

## 2020-11-27 ENCOUNTER — Ambulatory Visit: Payer: Managed Care, Other (non HMO) | Admitting: Family

## 2020-11-28 DIAGNOSIS — J029 Acute pharyngitis, unspecified: Secondary | ICD-10-CM | POA: Diagnosis not present

## 2020-11-28 DIAGNOSIS — R0789 Other chest pain: Secondary | ICD-10-CM | POA: Diagnosis not present

## 2020-11-28 DIAGNOSIS — M546 Pain in thoracic spine: Secondary | ICD-10-CM | POA: Diagnosis not present

## 2020-11-29 DIAGNOSIS — R0789 Other chest pain: Secondary | ICD-10-CM | POA: Diagnosis not present

## 2020-12-10 DIAGNOSIS — K219 Gastro-esophageal reflux disease without esophagitis: Secondary | ICD-10-CM | POA: Diagnosis not present

## 2020-12-10 DIAGNOSIS — J3489 Other specified disorders of nose and nasal sinuses: Secondary | ICD-10-CM | POA: Diagnosis not present

## 2021-01-29 ENCOUNTER — Encounter (HOSPITAL_BASED_OUTPATIENT_CLINIC_OR_DEPARTMENT_OTHER): Payer: Self-pay | Admitting: Obstetrics and Gynecology

## 2021-01-29 ENCOUNTER — Emergency Department (HOSPITAL_BASED_OUTPATIENT_CLINIC_OR_DEPARTMENT_OTHER): Payer: No Typology Code available for payment source

## 2021-01-29 ENCOUNTER — Emergency Department (HOSPITAL_BASED_OUTPATIENT_CLINIC_OR_DEPARTMENT_OTHER)
Admission: EM | Admit: 2021-01-29 | Discharge: 2021-01-29 | Disposition: A | Discharge status: Home / Self Care | Payer: No Typology Code available for payment source | Attending: Emergency Medicine | Admitting: Emergency Medicine

## 2021-01-29 ENCOUNTER — Other Ambulatory Visit: Payer: Self-pay

## 2021-01-29 DIAGNOSIS — R519 Headache, unspecified: Secondary | ICD-10-CM | POA: Diagnosis not present

## 2021-01-29 DIAGNOSIS — J019 Acute sinusitis, unspecified: Secondary | ICD-10-CM | POA: Insufficient documentation

## 2021-01-29 LAB — CBC WITH DIFFERENTIAL/PLATELET
Abs Immature Granulocytes: 0.01 10*3/uL (ref 0.00–0.07)
Basophils Absolute: 0.1 10*3/uL (ref 0.0–0.1)
Basophils Relative: 1 %
Eosinophils Absolute: 0.3 10*3/uL (ref 0.0–0.5)
Eosinophils Relative: 4 %
HCT: 36.2 % (ref 36.0–46.0)
Hemoglobin: 11.9 g/dL — ABNORMAL LOW (ref 12.0–15.0)
Immature Granulocytes: 0 %
Lymphocytes Relative: 27 %
Lymphs Abs: 1.8 10*3/uL (ref 0.7–4.0)
MCH: 30.1 pg (ref 26.0–34.0)
MCHC: 32.9 g/dL (ref 30.0–36.0)
MCV: 91.6 fL (ref 80.0–100.0)
Monocytes Absolute: 0.3 10*3/uL (ref 0.1–1.0)
Monocytes Relative: 4 %
Neutro Abs: 4.4 10*3/uL (ref 1.7–7.7)
Neutrophils Relative %: 64 %
Platelets: 249 10*3/uL (ref 150–400)
RBC: 3.95 MIL/uL (ref 3.87–5.11)
RDW: 12.7 % (ref 11.5–15.5)
WBC: 6.8 10*3/uL (ref 4.0–10.5)
nRBC: 0 % (ref 0.0–0.2)

## 2021-01-29 LAB — BASIC METABOLIC PANEL
Anion gap: 9 (ref 5–15)
BUN: 10 mg/dL (ref 6–20)
CO2: 24 mmol/L (ref 22–32)
Calcium: 8.5 mg/dL — ABNORMAL LOW (ref 8.9–10.3)
Chloride: 106 mmol/L (ref 98–111)
Creatinine, Ser: 0.79 mg/dL (ref 0.44–1.00)
GFR, Estimated: >60 mL/min (ref >60)
Glucose, Bld: 109 mg/dL — ABNORMAL HIGH (ref 70–99)
Potassium: 3.7 mmol/L (ref 3.5–5.1)
Sodium: 139 mmol/L (ref 135–145)

## 2021-01-29 LAB — MAGNESIUM: Magnesium: 1.9 mg/dL (ref 1.7–2.4)

## 2021-01-29 MED ORDER — AZITHROMYCIN 250 MG PO TABS: ORAL_TABLET | ORAL | 0 refills | Status: AC

## 2021-01-29 MED ORDER — KETOROLAC TROMETHAMINE 30 MG/ML IJ SOLN
30.0000 mg | Freq: Once | INTRAMUSCULAR | Status: AC
  Administered 2021-01-29: 30 mg via INTRAVENOUS
  Filled 2021-01-29: qty 1

## 2021-01-29 MED ORDER — SODIUM CHLORIDE 0.9 % IV BOLUS
1000.0000 mL | Freq: Once | INTRAVENOUS | Status: AC
  Administered 2021-01-29: 1000 mL via INTRAVENOUS

## 2021-01-29 MED ORDER — AZITHROMYCIN 250 MG PO TABS: ORAL_TABLET | ORAL | 0 refills | Status: DC

## 2021-01-29 NOTE — ED Provider Notes (Signed)
[facility] EMERGENCY DEPARTMENT Provider Note  CSN: [id number] Arrival date & time: 01/29/21 1902    History Chief Complaint  Patient presents with  . Headache    HPI  Allison Duncan is a 25 y.o. female reports about 3 weeks ago she had a headache and nasal congestion with blepharospasm for a few days after smoking marijuana. Those symptoms resolved but she smoked again last weekend and symptoms returned Monday and have been persistent since then. She has also had floaters in her eyes and spasms in her hands making her drop things. She reports the headache is diffuse, frontal and occipital. No fever, vomiting, diarrhea. No CP, SOB. She saw an eye doctor last week about her blepharospasm and exam was normal. She was seen at [facility] and sent to the ED for a CT scan.    History reviewed. No pertinent past medical history.  History reviewed. No pertinent surgical history.  Family History  Problem Relation Age of Onset  . Heart attack Maternal Grandmother   . Cancer Maternal Grandfather   . Diabetes Maternal Grandfather   . Hypertension Maternal Grandfather     Social History   Tobacco Use  . Smoking status: Never Smoker  . Smokeless tobacco: Never Used  Vaping Use  . Vaping Use: Never used  Substance Use Topics  . Alcohol use: Yes    Comment: 2 a month  . Drug use: No     Home Medications Prior to Admission medications   Medication Sig Start Date End Date Taking? Authorizing Provider  azithromycin (ZITHROMAX) 250 MG tablet Use as directed 01/29/21  Yes Pollyann Savoy, MD  Norgestimate-Ethinyl Estradiol Triphasic (ORTHO TRI-CYCLEN, 28,) 0.18/0.215/0.25 MG-35 MCG tablet Take one tablet daily by mouth 02/02/17   Verner Chol, CNM  terconazole (TERAZOL 7) 0.4 % vaginal cream Use 1 applicatorful vaginally every night for 7 nights 02/03/17   Verner Chol, CNM  tiZANidine (ZANAFLEX) 4 MG tablet Take 1 tablet (4 mg total) by mouth every 6 (six) hours as  needed for muscle spasms. 07/14/19   Jeannie Fend, PA-C  UNABLE TO FIND Refresh po    [provider]     Allergies    Amoxicillin-pot clavulanate, Cefdinir, and Cefprozil   Review of Systems   Review of Systems A comprehensive review of systems was completed and negative except as noted in HPI.    Physical Exam BP 114/63   Pulse 75   Temp 99.2 F (37.3 C) (Oral)   Resp 15   SpO2 100%   Physical Exam Vitals and nursing note reviewed.  Constitutional:      Appearance: Normal appearance.  HENT:     Head: Normocephalic and atraumatic.     Nose: Nose normal.     Mouth/Throat:     Mouth: Mucous membranes are moist.  Eyes:     Extraocular Movements: Extraocular movements intact.     Conjunctiva/sclera: Conjunctivae normal.  Cardiovascular:     Rate and Rhythm: Normal rate.  Pulmonary:     Effort: Pulmonary effort is normal.     Breath sounds: Normal breath sounds.  Abdominal:     General: Abdomen is flat.     Palpations: Abdomen is soft.     Tenderness: There is no abdominal tenderness.  Musculoskeletal:        General: No swelling. Normal range of motion.     Cervical back: Neck supple.  Skin:    General: Skin is warm and dry.  Neurological:  General: No focal deficit present.     Mental Status: She is alert and oriented to person, place, and time.     Cranial Nerves: No cranial nerve deficit.     Sensory: No sensory deficit.     Motor: No weakness.  Psychiatric:        Mood and Affect: Mood normal.      ED Results / Procedures / Treatments   Labs (all labs ordered are listed, but only abnormal results are displayed) Labs Reviewed  BASIC METABOLIC PANEL - Abnormal; Notable for the following components:      Result Value   Glucose, Bld 109 (*)    Calcium 8.5 (*)    All other components within normal limits  CBC WITH DIFFERENTIAL/PLATELET - Abnormal; Notable for the following components:   Hemoglobin 11.9 (*)    All other components within  normal limits  MAGNESIUM    EKG None   Radiology CT Head Wo Contrast  Result Date: 01/29/2021 CLINICAL DATA:  Headache.  Rule out intracranial hemorrhage EXAM: CT HEAD WITHOUT CONTRAST TECHNIQUE: Contiguous axial images were obtained from the base of the skull through the vertex without intravenous contrast. COMPARISON:  None. FINDINGS: Brain: No evidence of acute infarction, hemorrhage, hydrocephalus, extra-axial collection or mass lesion/mass effect. Vascular: Negative for hyperdense vessel Skull: Negative Sinuses/Orbits: Mild mucosal edema in the ethmoid sinuses bilaterally. Remaining sinuses clear. Negative orbit Other: None IMPRESSION: Normal CT of the brain Mild sinus mucosal disease. Electronically Signed   By: Marlan Palau M.D.   On: 01/29/2021 20:32    Procedures Procedures  Medications Ordered in the ED Medications  sodium chloride 0.9 % bolus 1,000 mL (0 mLs Intravenous Stopped 01/29/21 2112)  ketorolac (TORADOL) 30 MG/ML injection 30 mg (30 mg Intravenous Given 01/29/21 2014)     MDM Rules/Calculators/A&P MDM Patient with nonspecific headache, muscle spasms with a normal exam. Will check labs, including electrolytes/magnesium and send for head CT. IVF and Toradol for symptom improvement.  ED Course  I have reviewed the triage vital signs and the nursing notes.  Pertinent labs & imaging results that were available during my care of the patient were reviewed by me and considered in my medical decision making (see chart for details).  Clinical Course as of 01/29/21 2212  Fri Jan 29, 2021  2100 CT with out acute intracranial process. Mild sinus disease. Mag is normal, BMP with mild hypocalcemia may be contributing to her muscle spasms. [CS]  2211 Minimal improvement in pain after Toradol. No signs of life-threatening cause of headache. Could be sinusitis, will treat with Abx. Recommend OTC calcium supplements. Neurology follow up if not improving.  [CS]    Clinical Course  User Index [CS] Pollyann Savoy, MD    Final Clinical Impression(s) / ED Diagnoses Final diagnoses:  Acute intractable headache, unspecified headache type  Acute sinusitis, recurrence not specified, unspecified location  Hypocalcemia    Rx / DC Orders ED Discharge Orders         Ordered    azithromycin (ZITHROMAX) 250 MG tablet        01/29/21 2211           Pollyann Savoy, MD 01/29/21 2212

## 2021-01-29 NOTE — ED Triage Notes (Signed)
Patient reports to the ER sent from UC to get a head CT. Patient reports she has had headaches for a week. Denies N/V/D, chest pain or SOB
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
## 2021-06-02 ENCOUNTER — Ambulatory Visit: Payer: No Typology Code available for payment source | Admitting: Psychiatry

## 2021-06-08 ENCOUNTER — Other Ambulatory Visit: Payer: Self-pay | Admitting: *Deleted

## 2021-06-08 ENCOUNTER — Encounter: Payer: Self-pay | Admitting: *Deleted
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
## 2021-06-09 ENCOUNTER — Ambulatory Visit: Payer: No Typology Code available for payment source | Admitting: Psychiatry

## 2021-06-15 NOTE — Progress Notes (Signed)
Referring:  Spainhour, Kermit Balo, PA [address] [place],  [place] [address]  PCP: Pcp, No  Neurology was asked to evaluate Allison Duncan, a 25 year old female for a chief complaint of headaches.  Our recommendations of care will be communicated by shared medical record.    CC:  headaches, muscle twitching  HPI:  Medical co-morbidities: none  Between April and June 2022 she had a nonstop headache. Associated with photophobia, no phonophobia or nausea. Headache resolved in June and she has not had one since. Around the same time she developed twitching of her eyes. Twitching has been present for the past 3-4 years, but has been getting worse over the past several months. Initially only had eyelid twitching when she was smoking, but now it occurs every day. She has also begun to develop occasional muscle twitching in in her hands and legs. No weakness or change in sensation.  [facility] 01/29/21 was unremarkable other than mild sinus disease. She saw ENT in August who felt headaches were out of proportion to her sinus disease. Went to the eye doctor who noted a normal eye exam.   LABS: 11/28/20: TSH and magnesium wnl. BMP with mildly low calcium (8.5)  IMAGING:  [facility] 01/29/21: unremarkable  Imaging independently reviewed on June 16, 2021   Current Outpatient Medications on File Prior to Visit  Medication Sig Dispense Refill   CALCIUM PO Take by mouth.     MAGNESIUM PO Take by mouth.     Multiple Vitamins-Minerals (MULTIVITAL PO) Take by mouth.     omeprazole (PRILOSEC) 40 MG capsule Take one capsule 30 minutes before evening meal.     UNABLE TO FIND Refresh po     VITAMIN D PO Take by mouth.     azithromycin (ZITHROMAX) 250 MG tablet Use as directed 1 each 0   No current facility-administered medications on file prior to visit.     Allergies: Allergies  Allergen Reactions   Amoxicillin-Pot Clavulanate Rash   Cefdinir Rash   Cefprozil Rash    Family History   Problem Relation Age of Onset   High blood pressure Mother    Diabetes Mother    Diabetes Father    High blood pressure Father    Heart attack Maternal Grandmother    Cancer Maternal Grandfather    Diabetes Maternal Grandfather    Hypertension Maternal Grandfather     Past Medical History: Past Medical History:  Diagnosis Date   Headache     Past Surgical History History reviewed. No pertinent surgical history.  Social History: Social History   Tobacco Use   Smoking status: Former    Types: Cigarettes   Smokeless tobacco: Never  Vaping Use   Vaping Use: Never used  Substance Use Topics   Alcohol use: Yes    Comment: 2 a month   Drug use: Yes    Types: Marijuana    ROS: Negative for fevers, chills. Positive for eyelid twitching, muscle twitching. All other systems reviewed and negative unless stated otherwise in HPI.   Physical Exam:   Vital Signs: BP 122/84   Pulse 77   Ht 5\' 3"  (1.6 m)   Wt 232 lb (105.2 kg)   SpO2 98%   BMI 41.10 kg/m  GENERAL: well appearing,in no acute distress,alert SKIN:  Color, texture, turgor normal. No rashes or lesions HEAD:  Normocephalic/atraumatic. CV:  RRR RESP: Normal respiratory effort MSK: right eyelid twitching noted after touching her eyelid, lasts for a  few seconds then resolves  NEUROLOGICAL: Mental Status: Alert, oriented to person, place and time,Follows commands Cranial Nerves: PERRL,visual fields intact to confrontation,extraocular movements intact,facial sensation intact,no facial droop or ptosis,hearing intact to finger rub bilaterally,no dysarthria,palate elevate symmetrically,tongue protrudes midline,shoulder shrug intact and symmetric Motor: muscle strength 5/5 both upper and lower extremities,no drift, normal tone Reflexes: 2+ throughout Sensation: intact to light touch all 4 extremities Coordination: Finger-to- nose-finger intact bilaterally,Heel-to-shin intact bilaterally Gait:  normal-based   IMPRESSION: 25 year old female without significant medical history who presents for evaluation of muscle twitching and headaches. Her headaches have resolved and she has been headache free for the past 4 months. Will check parathyroid labs given low calcium noted on last BMP. She has over the counter calcium and magnesium supplements but has been taking them inconsistently. Counseled her to start taking these consistently and see if symptoms improve. Will re-evaluate symptoms in 3 months to determine if further testing such as EMG is needed.  PLAN: -Blood work: PTH, calcium, phosphorous, vitamin D -Counseled on taking calcium and magnesium supplements consistently -Counseled to quit smoking to reduce cancer and cardiovascular risk, and because this can worsen fasciculations -next steps: consider EMG if blood work normal and no improvement in twitching with supplementation  Follow-up: 3 months   Ocie Doyne, MD 06/16/2021   9:37 AM

## 2021-06-16 ENCOUNTER — Encounter: Payer: Self-pay | Admitting: Psychiatry

## 2021-06-16 ENCOUNTER — Ambulatory Visit (INDEPENDENT_AMBULATORY_CARE_PROVIDER_SITE_OTHER): Payer: No Typology Code available for payment source | Admitting: Psychiatry

## 2021-06-16 VITALS — BP 122/84 | HR 77 | Ht 63.0 in | Wt 232.0 lb

## 2021-06-16 DIAGNOSIS — R253 Fasciculation: Secondary | ICD-10-CM

## 2021-06-16 DIAGNOSIS — R252 Cramp and spasm: Secondary | ICD-10-CM

## 2021-06-16 NOTE — Patient Instructions (Addendum)
Take over the counter calcium and magnesium supplements daily Blood work Smoking can increase muscle twitching as well as increase risk of cancer, heart disease, and stroke. Try to cut back/stop smoking and see if this helps with muscle twitching.

## 2021-06-17 ENCOUNTER — Telehealth: Payer: Self-pay | Admitting: Psychiatry

## 2021-06-17 LAB — PTH, INTACT AND CALCIUM
Calcium: 9 mg/dL (ref 8.7–10.2)
PTH: 31 pg/mL (ref 15–65)

## 2021-06-17 LAB — VITAMIN D 25 HYDROXY (VIT D DEFICIENCY, FRACTURES): Vit D, 25-Hydroxy: 29.1 ng/mL — ABNORMAL LOW (ref 30.0–100.0)

## 2021-06-17 LAB — PHOSPHORUS: Phosphorus: 3.1 mg/dL (ref 3.0–4.3)

## 2021-06-17 NOTE — Telephone Encounter (Signed)
Pt received notifications that her results are available but she is unable to access them, pt asking for a call with results.

## 2021-06-17 NOTE — Telephone Encounter (Signed)
I called pt back and I relayed results from mychart message on 06/17/2021  Your parathyroid levels are normal, but your calcium and vitamin D levels are still low. Please continue taking calcium and vitamin D supplements.   Thanks, ...  Pt verbalized understanding and will continue supplementation of vit D and calcium at this time.

## 2021-07-04 IMAGING — CT CT HEAD W/O CM
4 series · 16 of 47 positions shown, 18 images · non-contrast
Comparison: None.

CLINICAL DATA: Headache.  Rule out intracranial hemorrhage

EXAM:
CT HEAD WITHOUT CONTRAST
TECHNIQUE: Contiguous axial images were obtained from the base of the skull
through the vertex without intravenous contrast.

[Series 2: head wo · axial · 0.45mm/px · z∈[-446,-336]mm · 7 of 30 slices shown, 9 images]
[im 4/30  brain]
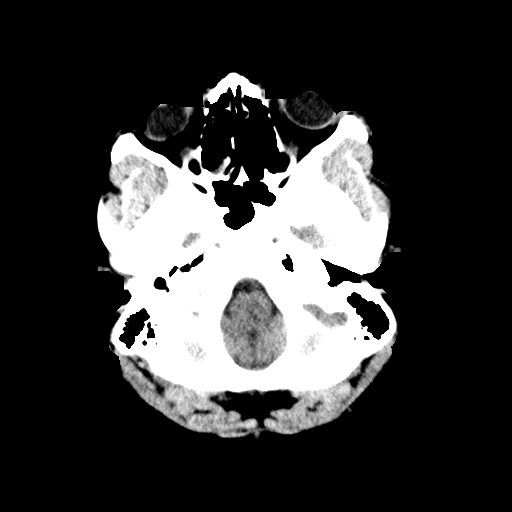
[im 4/30  bone]
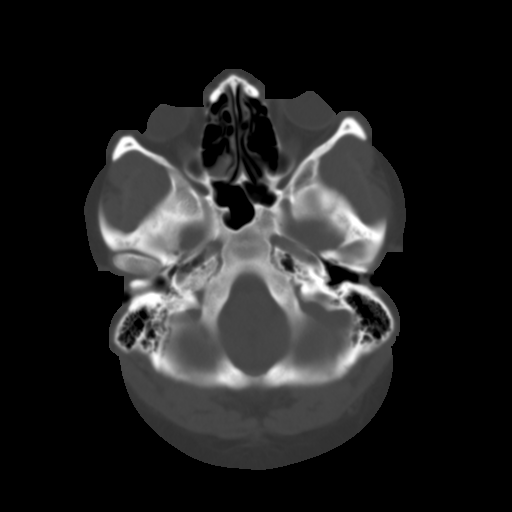
[im 8/30  brain]
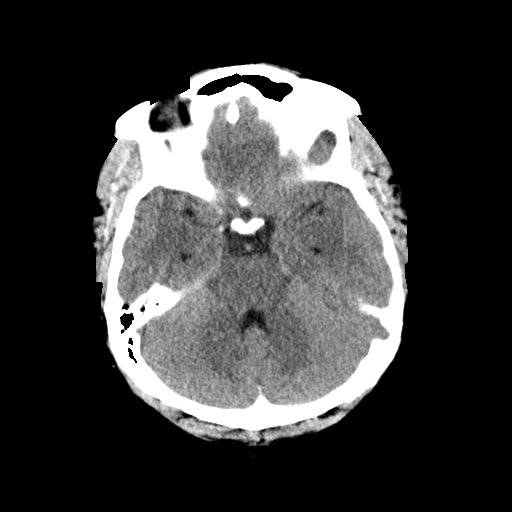
[im 11/30  brain]
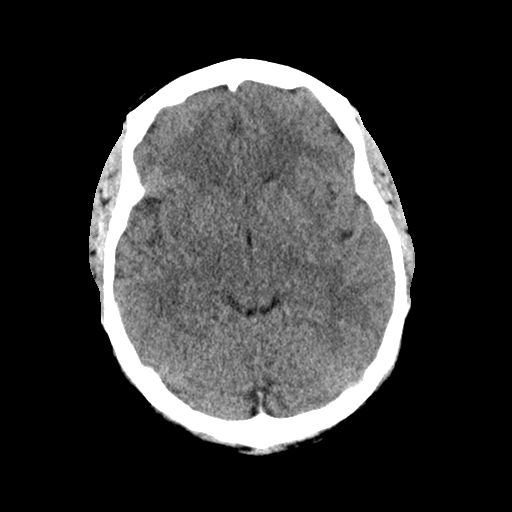
[im 15/30  brain]
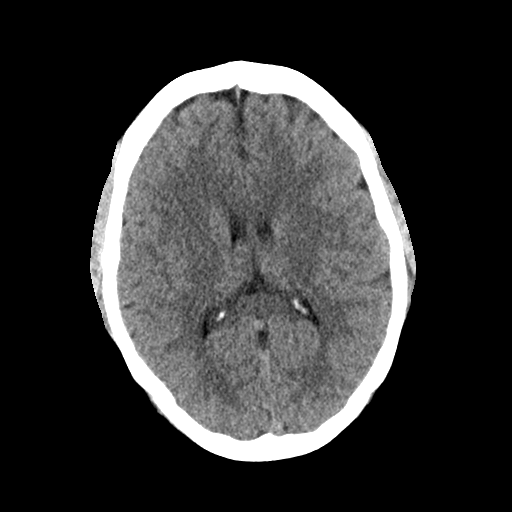
[im 19/30  brain]
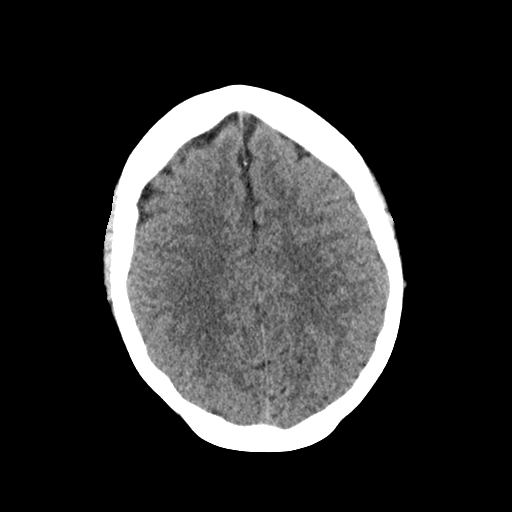
[im 19/30  bone]
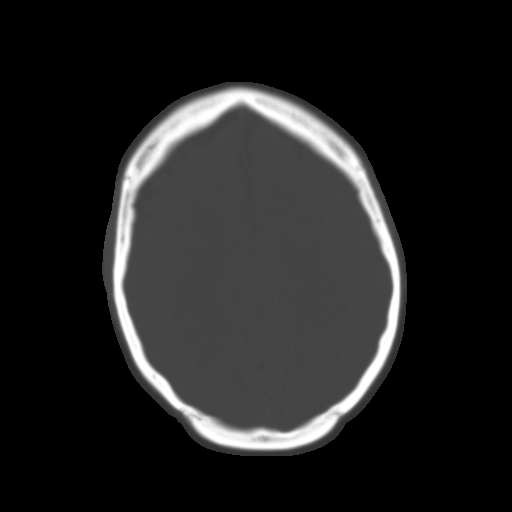
[im 22/30  brain]
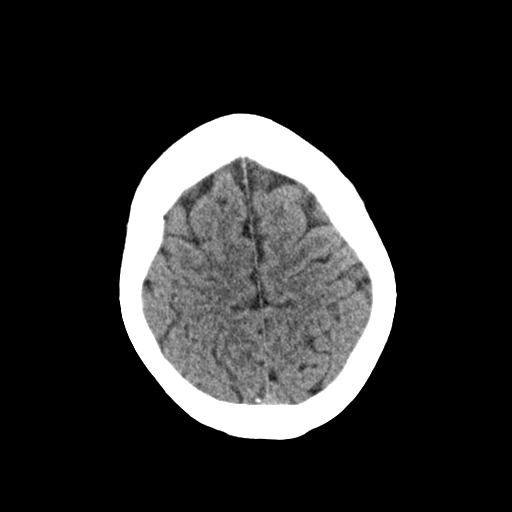
[im 26/30  brain]
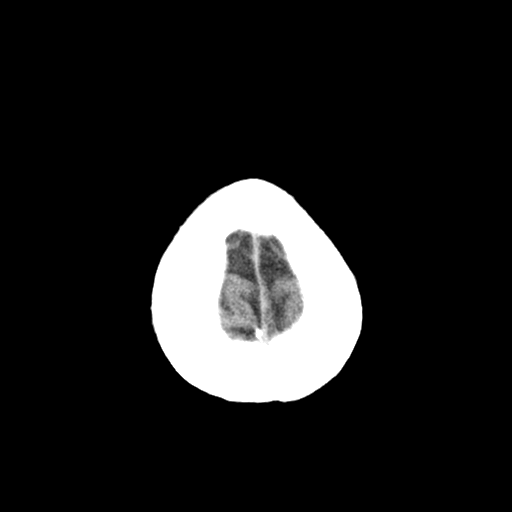

[Series 3: head bone · axial · 0.45mm/px · z∈[-447,-419]mm · 3 of 74 slices shown]
[im 8/74  bone]
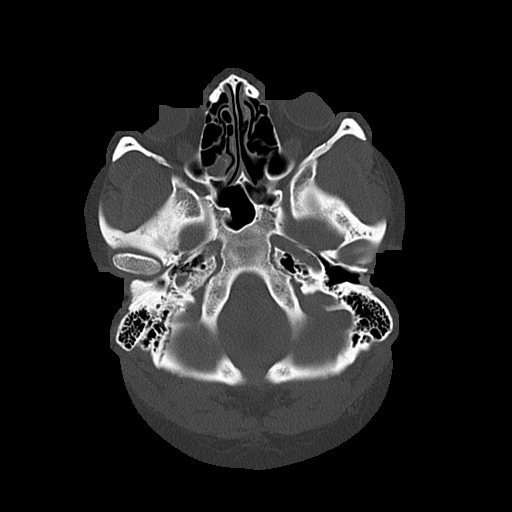
[im 15/74  bone]
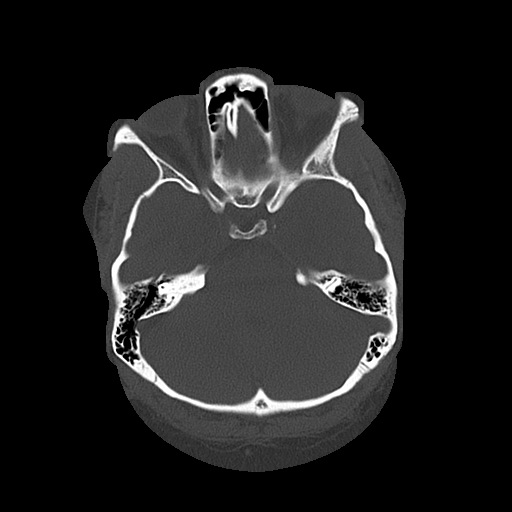
[im 22/74  bone]
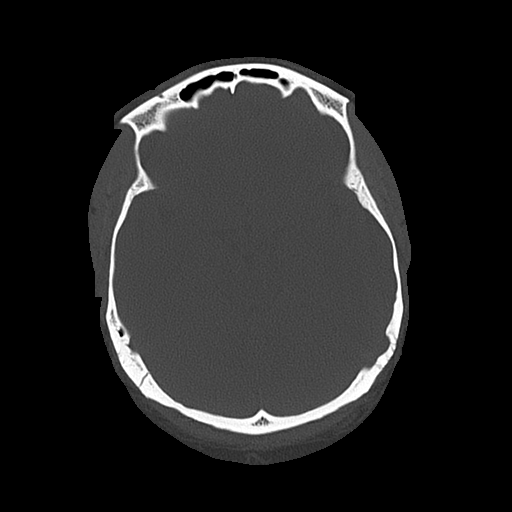

[Series 4: coronal soft · coronal · 0.31mm/px · 3 of 69 slices shown]
[im 23/69  brain]
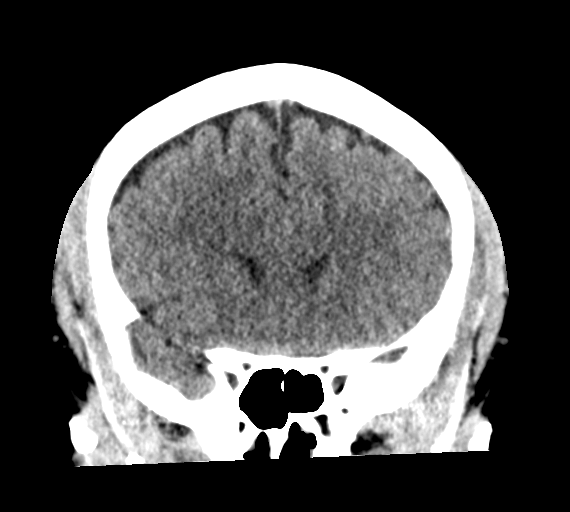
[im 31/69  brain]
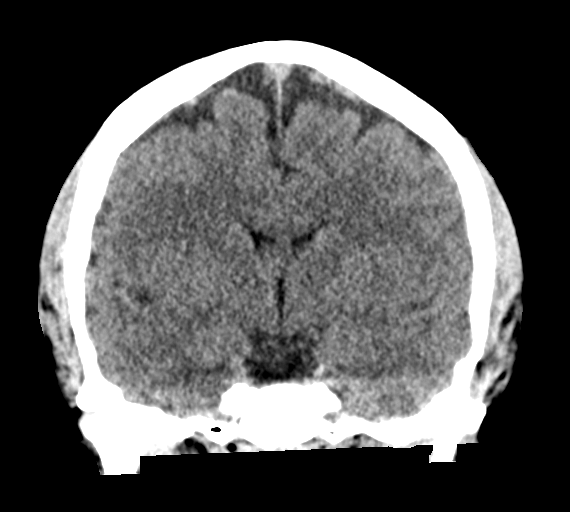
[im 38/69  brain]
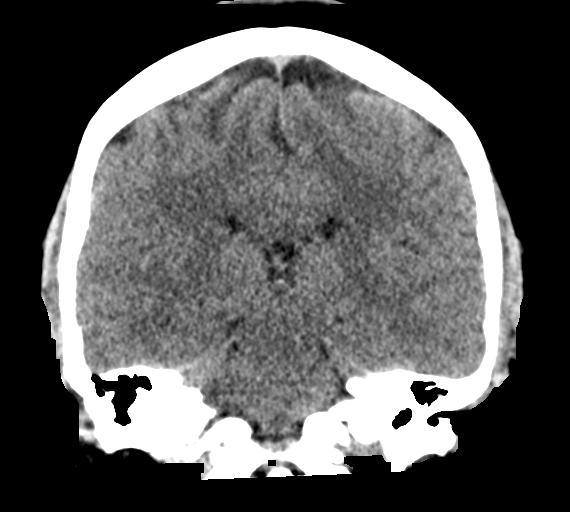

[Series 5: sagittal soft · sagittal · 0.31mm/px · 3 of 55 slices shown]
[im 19/55  brain]
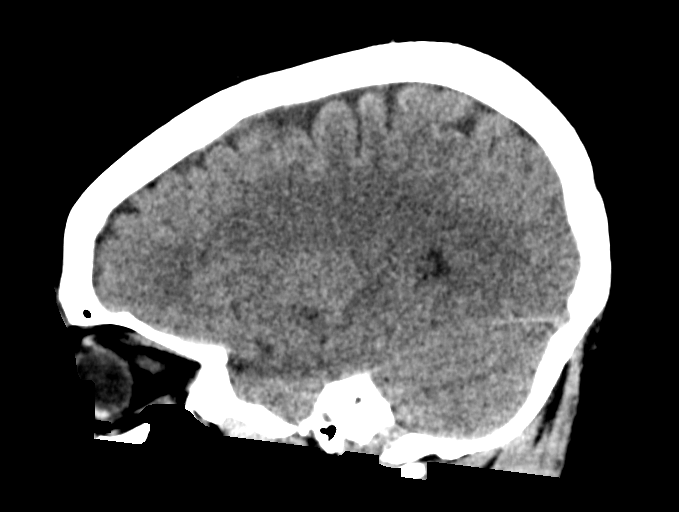
[im 28/55  brain]
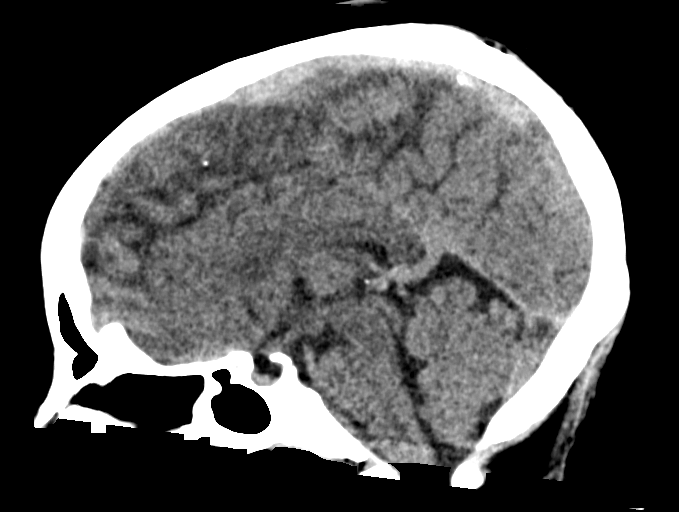
[im 37/55  brain]
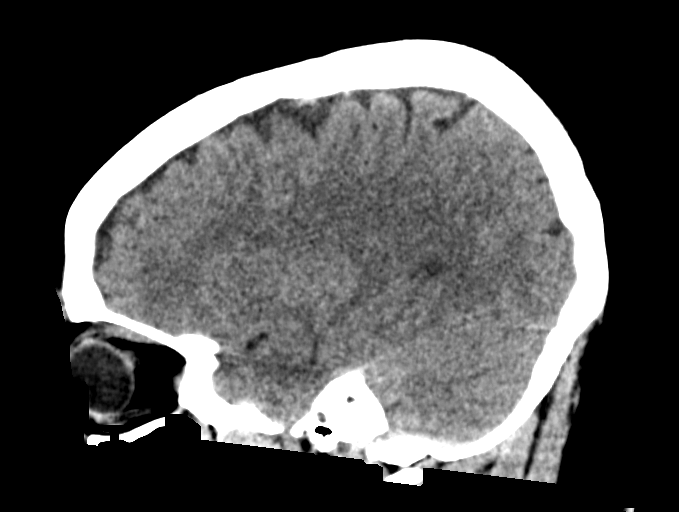

[16 of 47 positions shown; findings below may reference images not displayed]

FINDINGS: Brain: No evidence of acute infarction, hemorrhage, hydrocephalus,
extra-axial collection or mass lesion/mass effect.

Vascular: Negative for hyperdense vessel

Skull: Negative

Sinuses/Orbits: Mild mucosal edema in the ethmoid sinuses
bilaterally. Remaining sinuses clear. Negative orbit

Other: None
IMPRESSION: Normal CT of the brain

Mild sinus mucosal disease.

## 2021-09-29 ENCOUNTER — Ambulatory Visit: Payer: No Typology Code available for payment source | Admitting: Psychiatry

## 2021-10-28 ENCOUNTER — Other Ambulatory Visit: Payer: Self-pay

## 2021-10-28 ENCOUNTER — Ambulatory Visit (INDEPENDENT_AMBULATORY_CARE_PROVIDER_SITE_OTHER): Payer: No Typology Code available for payment source | Admitting: Psychiatry

## 2021-10-28 VITALS — BP 140/83 | HR 67 | Ht 63.0 in | Wt 225.2 lb

## 2021-10-28 DIAGNOSIS — M2550 Pain in unspecified joint: Secondary | ICD-10-CM

## 2021-10-28 DIAGNOSIS — R202 Paresthesia of skin: Secondary | ICD-10-CM | POA: Diagnosis not present

## 2021-10-28 NOTE — Progress Notes (Signed)
° °  CC:  fasciculations  Follow-up Visit  Last visit: 06/16/21  Brief HPI: 26 year old female who follows in clinic for twitching in her eyes, hands, and legs. [facility] 01/29/21 was unremarkable.   At her last visit, she was found to have low vitamin D levels. She was encouraged to continue vitamin D and calcium supplements.  Interval History: Since her last visit she has had improvement in her twitching. She has been trying to take her supplements more consistently and has cut back on smoking which seems to have helped.   Now she has begun to notice aching and tingling in her fingers on both hands. Feels like her joints are stiff and painful. Sometimes she will have paresthesias that start in her shoulder and radiate down her left arm. Feels both hands are weaker as well, especially her first three fingers.   Physical Exam:   Vital Signs: BP 140/83    Pulse 67    Ht 5\' 3"  (1.6 m)    Wt 225 lb 4 oz (102.2 kg)    BMI 39.90 kg/m  GENERAL:  well appearing, in no acute distress, alert  SKIN:  Color, texture, turgor normal. No rashes or lesions HEAD:  Normocephalic/atraumatic. RESP: normal respiratory effort MSK:  No gross joint deformities.   NEUROLOGICAL: Mental Status: Alert, oriented to person, place and time, Follows commands, and Speech fluent and appropriate. Cranial Nerves: PERRL, face symmetric, no dysarthria, hearing grossly intact Motor: 5/5 strength all 4 extremities Sensation: mildly decreased sensation to pinprick in a stocking and glove fashion upper and lower extremities, most pronounced over first three fingers in bilateral hands Gait: normal-based.  IMPRESSION: 26 year old female who presents for follow up of twitching in her eyelid, hands, and legs. Twitching has improved but now she endorses paresthesias and aching in her hands and feet. Exam is significant for decreased sensation to pinprick in stocking and glove fashion. Will check neuropathy labs today. Will also check  ANA as she is reporting new joint pain and stiffness. She may also have an element of carpal tunnel given decreased sensation and weakness is more prominent in her first three fingers. Recommended trying a wrist splint at night to see if this helps alleviate symptoms.  PLAN: -A1c, B12, IFE, calcium, vitamin D, ANA -Try wrist splint at night -Consider EMG if blood work normal and no improvement with wrist splint  Follow-up: after testing  I spent a total of 34 minutes on the date of the service. Discussed medication side effects, adverse reactions and drug interactions. Written educational materials and patient instructions outlining all of the above were given.  34, MD 10/28/21 11:34 AM

## 2021-10-28 NOTE — Patient Instructions (Signed)
Blood work today Try sleeping with a wrist splint Consider EMG if symptoms don't improve with wrist splint

## 2021-11-04 LAB — IMMUNOFIXATION ELECTROPHORESIS
IgA/Immunoglobulin A, Serum: 165 mg/dL (ref 87–352)
IgG (Immunoglobin G), Serum: 1228 mg/dL (ref 586–1602)
IgM (Immunoglobulin M), Srm: 90 mg/dL (ref 26–217)
Total Protein: 6.9 g/dL (ref 6.0–8.5)

## 2021-11-04 LAB — HEMOGLOBIN A1C
Est. average glucose Bld gHb Est-mCnc: 108 mg/dL
Hgb A1c MFr Bld: 5.4 % (ref 4.8–5.6)

## 2021-11-04 LAB — ANA: ANA Titer 1: NEGATIVE

## 2021-11-04 LAB — VITAMIN D 1,25 DIHYDROXY
Vitamin D 1, 25 (OH)2 Total: 70 pg/mL — ABNORMAL HIGH
Vitamin D2 1, 25 (OH)2: <10 pg/mL
Vitamin D3 1, 25 (OH)2: 67 pg/mL

## 2021-11-04 LAB — VITAMIN B12: Vitamin B-12: 460 pg/mL (ref 232–1245)

## 2021-11-04 LAB — CALCIUM: Calcium: 9.4 mg/dL (ref 8.7–10.2)
